# Patient Record
Sex: Male | Born: 1960 | Race: White | Hispanic: No | Marital: Married
Health system: Southern US, Community
[De-identification: ages and names within clinical notes are randomized; demographics above are authoritative.]

---

## 2021-02-26 ENCOUNTER — Other Ambulatory Visit: Payer: Self-pay

## 2021-02-26 ENCOUNTER — Emergency Department (HOSPITAL_BASED_OUTPATIENT_CLINIC_OR_DEPARTMENT_OTHER)
Admission: EM | Admit: 2021-02-26 | Discharge: 2021-02-26 | Disposition: A | Discharge status: Home / Self Care | Payer: No Typology Code available for payment source | Attending: Emergency Medicine | Admitting: Emergency Medicine

## 2021-02-26 ENCOUNTER — Emergency Department (HOSPITAL_BASED_OUTPATIENT_CLINIC_OR_DEPARTMENT_OTHER): Payer: No Typology Code available for payment source

## 2021-02-26 DIAGNOSIS — M25562 Pain in left knee: Secondary | ICD-10-CM | POA: Insufficient documentation

## 2021-02-26 DIAGNOSIS — W1789XA Other fall from one level to another, initial encounter: Secondary | ICD-10-CM | POA: Diagnosis not present

## 2021-02-26 DIAGNOSIS — S8992XA Unspecified injury of left lower leg, initial encounter: Secondary | ICD-10-CM | POA: Diagnosis present

## 2021-02-26 DIAGNOSIS — S8392XA Sprain of unspecified site of left knee, initial encounter: Secondary | ICD-10-CM | POA: Insufficient documentation

## 2021-02-26 MED ORDER — HYDROCODONE-ACETAMINOPHEN 5-325 MG PO TABS
1.0000 | ORAL_TABLET | Freq: Once | ORAL | Status: AC
  Administered 2021-02-26: 1 via ORAL
  Filled 2021-02-26: qty 1

## 2021-02-26 MED ORDER — HYDROCODONE-ACETAMINOPHEN 5-325 MG PO TABS: 1.0000 | ORAL_TABLET | Freq: Four times a day (QID) | ORAL | 0 refills | Status: AC | PRN

## 2021-02-26 NOTE — ED Provider Notes (Signed)
[facility] EMERGENCY DEPARTMENT Provider Note  CSN: [id number] Arrival date & time: 02/26/21 0906    History Chief Complaint  Patient presents with   Knee Pain    Luke Peterson is a 60 y.o. male reports he fell while getting down off his truck 10 days ago, twisting and injuring his L knee. Reports he heard three pops. He has had worsening pain since then, initially able to bear weight but now he cannot, even with a knee brace. He has been taking OTC APAP without improvement.    No past medical history on file.    No family history on file.      Home Medications Prior to Admission medications   Medication Sig Start Date End Date Taking? Authorizing Provider  HYDROcodone-acetaminophen (NORCO/VICODIN) 5-325 MG tablet Take 1 tablet by mouth every 6 (six) hours as needed for severe pain. 02/26/21  Yes Pollyann Savoy, MD     Allergies    Patient has no allergy information on record.   Review of Systems   Review of Systems A comprehensive review of systems was completed and negative except as noted in HPI.    Physical Exam BP 137/87   Pulse 85   Temp 98.5 F (36.9 C) (Oral)   Resp 18   Ht 5\' 10"  (1.778 m)   Wt 111.1 kg   SpO2 99%   BMI 35.15 kg/m   Physical Exam Vitals and nursing note reviewed.  HENT:     Head: Normocephalic.     Nose: Nose normal.  Eyes:     Extraocular Movements: Extraocular movements intact.  Pulmonary:     Effort: Pulmonary effort is normal.  Musculoskeletal:        General: Tenderness (L knee) present. Normal range of motion.     Cervical back: Neck supple.     Comments: Mild swelling L knee and some trace edema distally, no significant effusion, erythema or warmth. No instability with anterior/posterior drawer. No crepitus  Skin:    Findings: No rash (on exposed skin).  Neurological:     Mental Status: He is alert and oriented to person, place, and time.  Psychiatric:        Mood and Affect: Mood normal.      ED Results / Procedures / Treatments   Labs (all labs ordered are listed, but only abnormal results are displayed) Labs Reviewed - No data to display  EKG None   Radiology DG Knee Complete 4 Views Left  Result Date: 02/26/2021 CLINICAL DATA:  60 year old male status post fall on July 3rd with continued left knee pain and swelling. EXAM: LEFT KNEE - COMPLETE 4+ VIEW COMPARISON:  None. FINDINGS: Small joint effusion identified on the lateral. Patella appears intact. Preserved alignment at the knee. Mild medial and patellofemoral compartment degenerative spurring but preserved joint spaces. Distal femur, proximal tibia and fibula appear intact. Bone mineralization is within normal limits. IMPRESSION: Small joint effusion. But no fracture or dislocation identified about the left knee. Electronically Signed   By: 05-12-1978 M.D.   On: 02/26/2021 09:59    Procedures Procedures  Medications Ordered in the ED Medications  HYDROcodone-acetaminophen (NORCO/VICODIN) 5-325 MG per tablet 1 tablet (1 tablet Oral Given 02/26/21 0949)     MDM Rules/Calculators/A&P MDM Patient with L knee injury 10 days ago, likely ligamentous but given worsening pain will check xrays. Norco for pain.   ED Course  I have reviewed the triage vital signs and the nursing notes.  Pertinent labs & imaging results that were available during my care of the patient were reviewed by me and considered in my medical decision making (see chart for details).  Clinical Course as of 02/26/21 1031  Wed Feb 26, 2021  1003 Xray is neg for fracture. Will place in full sized knee immobilizer, crutches for ambulation and refer to Ortho for follow up. Norco for pain.  [CS]    Clinical Course User Index [CS] Pollyann Savoy, MD    Final Clinical Impression(s) / ED Diagnoses Final diagnoses:  Sprain of left knee, unspecified ligament, initial encounter    Rx / DC Orders ED Discharge Orders          Ordered     HYDROcodone-acetaminophen (NORCO/VICODIN) 5-325 MG tablet  Every 6 hours PRN        02/26/21 1017             Pollyann Savoy, MD 02/26/21 1031

## 2021-02-26 NOTE — ED Triage Notes (Signed)
Larey Seat from back of pickup truck July 3. L knee pain/swelling now worse.

## 2021-02-26 NOTE — ED Notes (Signed)
ED Provider at bedside. Dr. Sheldon 

## 2022-02-22 IMAGING — DX DG KNEE COMPLETE 4+V*L*
4 series · 4 of 4 positions shown · non-contrast
Comparison: None.

CLINICAL DATA: 60-year-old male status post fall on [DATE][REDACTED] with
continued left knee pain and swelling.

EXAM:
LEFT KNEE - COMPLETE 4+ VIEW

[knee ap]
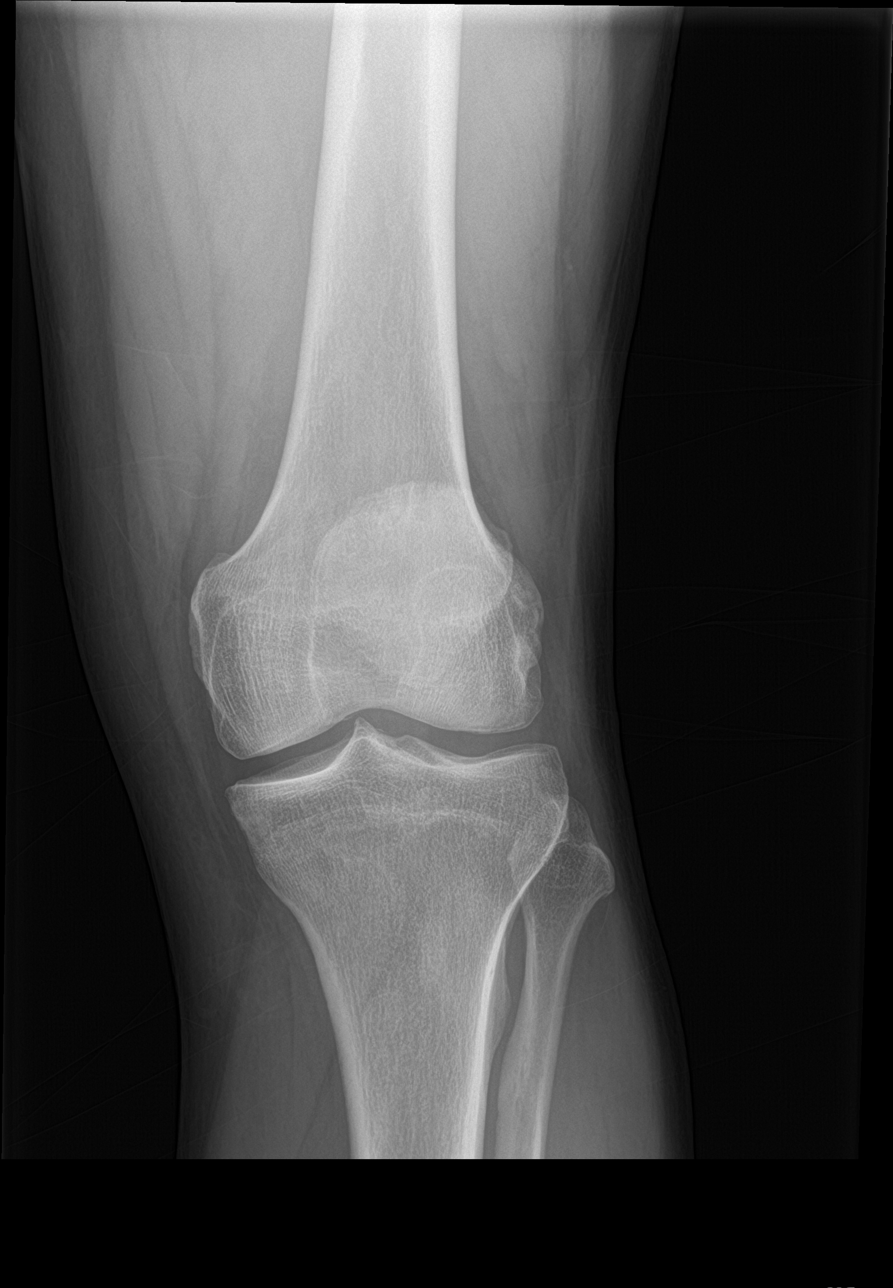

[knee lat]
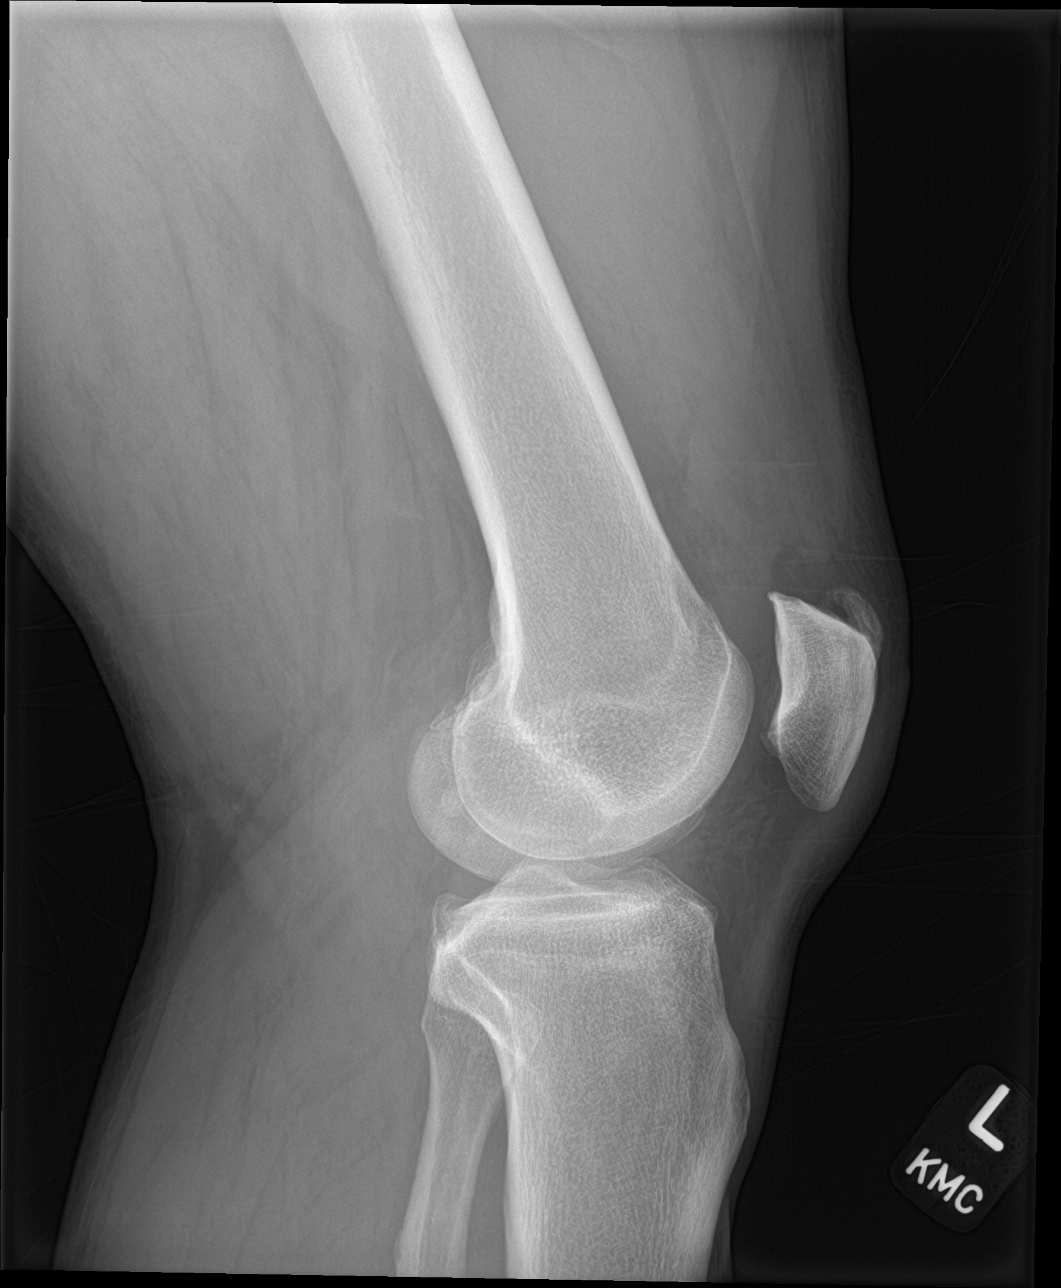

[knee obl (1 of 2)]
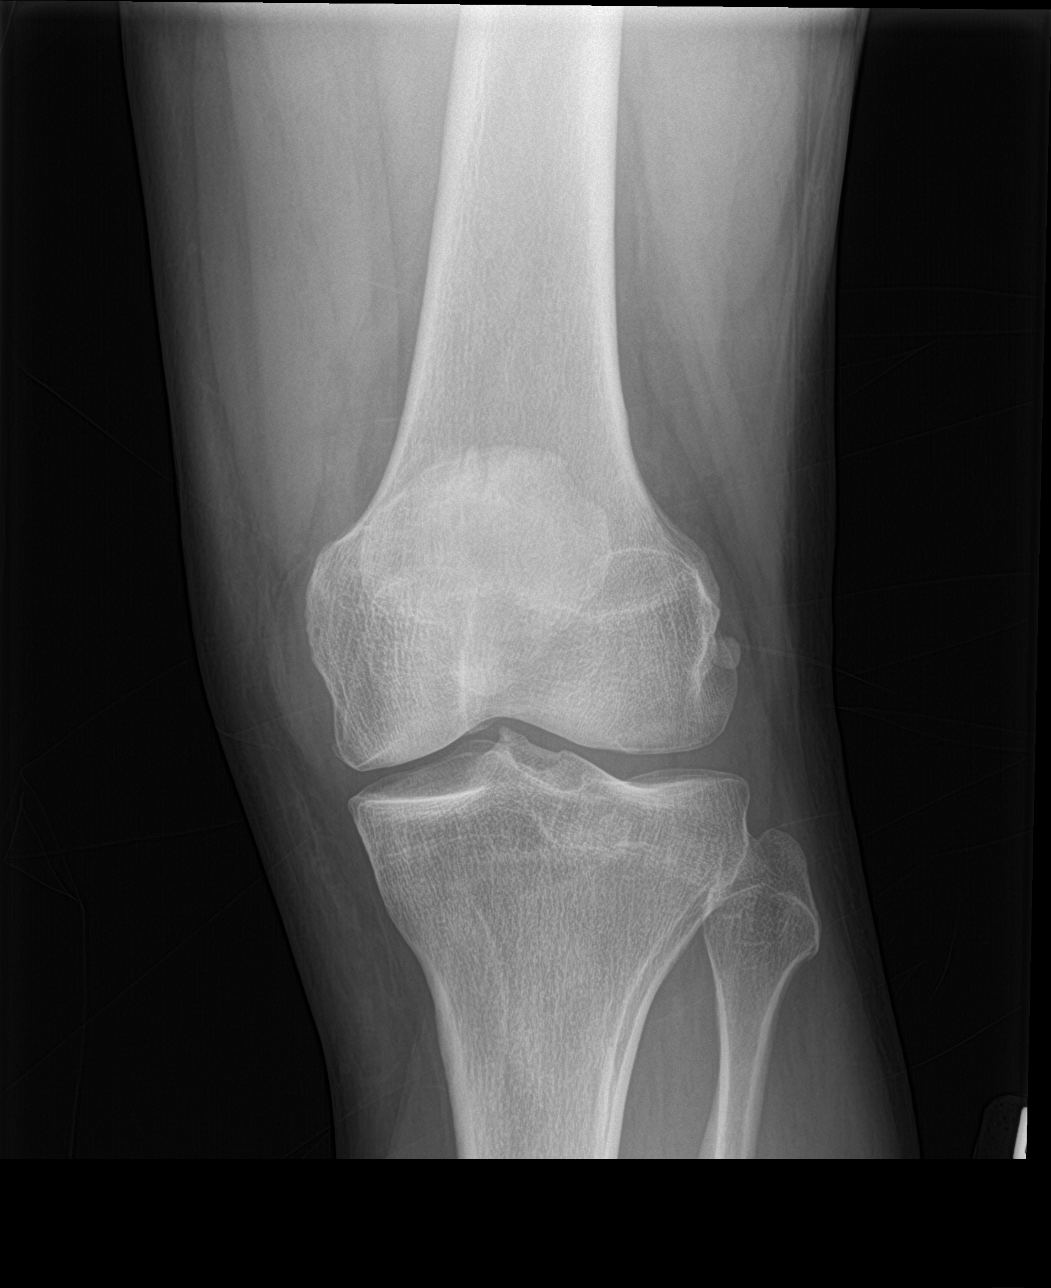

[knee obl (2 of 2)]
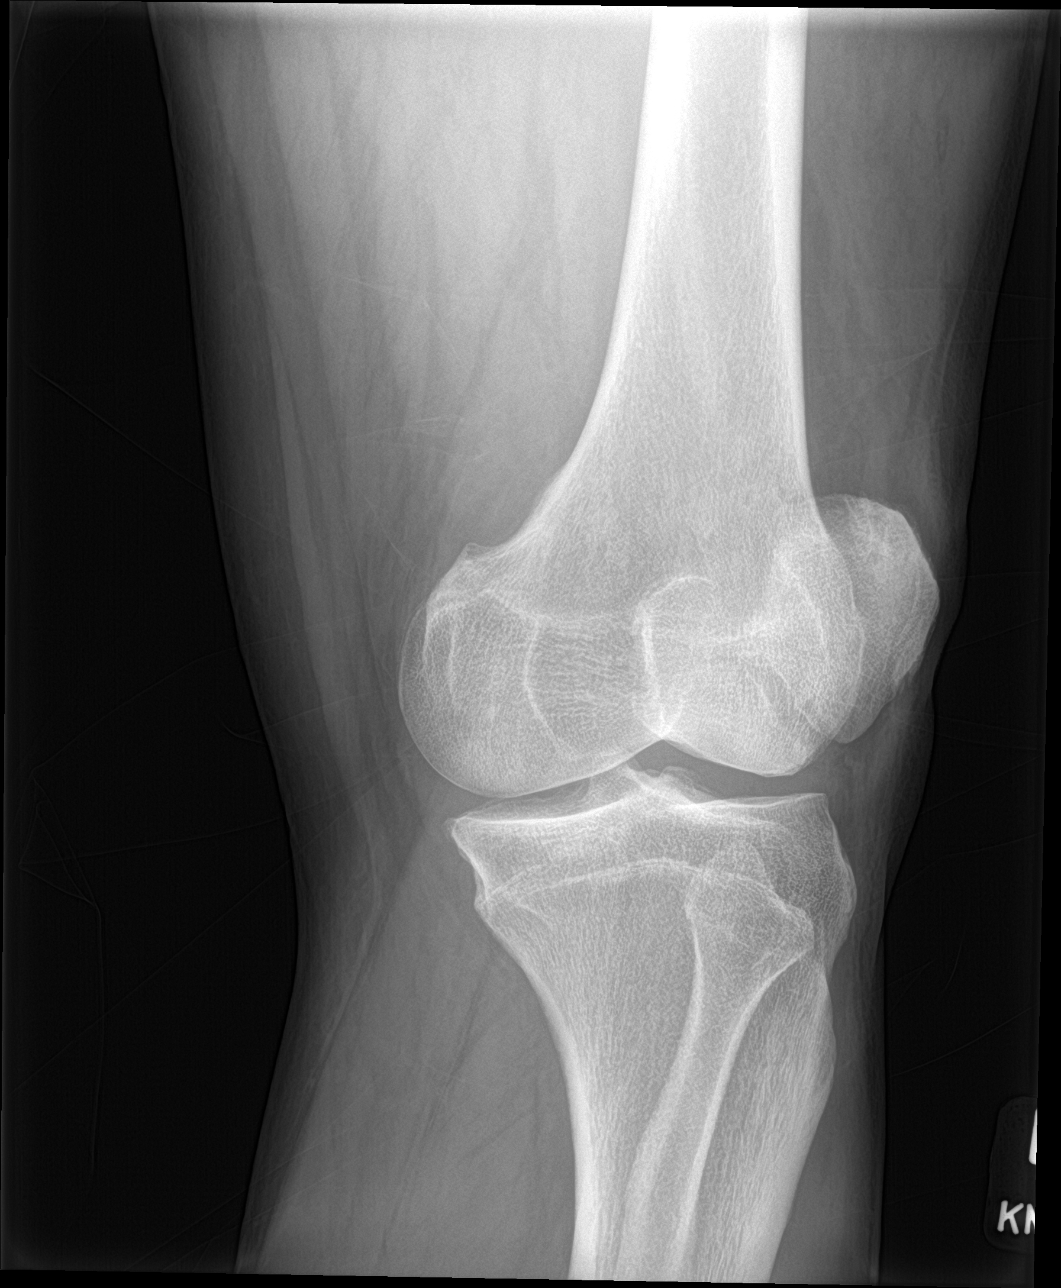

[4 of 4 positions shown; findings below may reference images not displayed]

FINDINGS: Small joint effusion identified on the lateral. Patella appears
intact. Preserved alignment at the knee. Mild medial and
patellofemoral compartment degenerative spurring but preserved joint
spaces. Distal femur, proximal tibia and fibula appear intact. Bone
mineralization is within normal limits.
IMPRESSION: Small joint effusion. But no fracture or dislocation identified
about the left knee.
